# Patient Record
Sex: Male | Born: 2000 | Race: White | Hispanic: No | Marital: Single | Smoking: Never smoker
Health system: Southern US, Community
[De-identification: ages and names within clinical notes are randomized; demographics above are authoritative.]

## PROBLEM LIST (undated history)

## (undated) DIAGNOSIS — Z8489 Family history of other specified conditions: Secondary | ICD-10-CM

---
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
## 2021-05-21 DIAGNOSIS — J189 Pneumonia, unspecified organism: Secondary | ICD-10-CM

## 2021-05-21 HISTORY — DX: Pneumonia, unspecified organism: J18.9

## 2021-06-12 ENCOUNTER — Telehealth: Payer: Self-pay | Admitting: Pediatrics

## 2021-06-12 NOTE — Telephone Encounter (Signed)
Opened in error

## 2021-11-12 ENCOUNTER — Other Ambulatory Visit: Payer: Self-pay | Admitting: Orthopedic Surgery

## 2021-11-12 DIAGNOSIS — S72354D Nondisplaced comminuted fracture of shaft of right femur, subsequent encounter for closed fracture with routine healing: Secondary | ICD-10-CM

## 2021-11-16 ENCOUNTER — Ambulatory Visit
Admission: RE | Admit: 2021-11-16 | Discharge: 2021-11-16 | Disposition: A | Discharge status: Home / Self Care | Payer: BC Managed Care – PPO | Source: Ambulatory Visit | Attending: Orthopedic Surgery | Admitting: Orthopedic Surgery

## 2021-11-16 DIAGNOSIS — S72354D Nondisplaced comminuted fracture of shaft of right femur, subsequent encounter for closed fracture with routine healing: Secondary | ICD-10-CM

## 2021-12-02 ENCOUNTER — Other Ambulatory Visit: Payer: Self-pay

## 2021-12-14 ENCOUNTER — Encounter (HOSPITAL_COMMUNITY): Payer: Self-pay | Admitting: Orthopedic Surgery

## 2021-12-14 ENCOUNTER — Other Ambulatory Visit: Payer: Self-pay
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
## 2021-12-14 NOTE — Progress Notes (Signed)
I called the number listed for Steven Sherman, it is his mother's number, Art Buff,.  I explained to Ms MItchell that I will need Matthes's permission to speak with her, mother gave me patient's number.  I spoke to [facility] and he gave me permission to speak to his mother. ? ?Steven Sherman' s PCP is Dr. Vinnie Langton. Ms Clovis Riley stated that Binette has not had chest pain or shortness of breath. Ms Clovis Riley denies having any s/s of Covid in her household.  Patient denies any known exposure to Covid.  ? ?I instructed Ms Clovis Riley to have Nole to shower with antibacteria soap.  No nail polish, artificial or acrylic nails. Wear clean clothes, brush your teeth. ?Glasses, contact lens,dentures or partials may not be worn in the OR. If you need to wear them, please bring a case for glasses, do not wear contacts or bring a case, the hospital does not have contact cases, dentures or partials will have to be removed , make sure they are clean, we will provide a denture cup to put them in. You will need some one to drive you home and a responsible person over the age of 4 to stay with you for the first 24 hours after surgery.  ? ? ?

## 2021-12-15 ENCOUNTER — Ambulatory Visit (HOSPITAL_COMMUNITY)
Admission: RE | Admit: 2021-12-15 | Discharge: 2021-12-15 | Disposition: A | Discharge status: Home / Self Care | Payer: BC Managed Care – PPO | Attending: Orthopedic Surgery | Admitting: Orthopedic Surgery

## 2021-12-15 ENCOUNTER — Other Ambulatory Visit: Payer: Self-pay

## 2021-12-15 ENCOUNTER — Encounter (HOSPITAL_COMMUNITY)
Admission: RE | Disposition: A | Discharge status: Home / Self Care | Payer: Self-pay | Source: Home / Self Care | Attending: Orthopedic Surgery

## 2021-12-15 ENCOUNTER — Encounter (HOSPITAL_COMMUNITY): Payer: Self-pay | Admitting: Orthopedic Surgery

## 2021-12-15 ENCOUNTER — Ambulatory Visit (HOSPITAL_COMMUNITY): Payer: BC Managed Care – PPO

## 2021-12-15 ENCOUNTER — Ambulatory Visit (HOSPITAL_COMMUNITY): Payer: BC Managed Care – PPO | Admitting: Certified Registered"

## 2021-12-15 ENCOUNTER — Other Ambulatory Visit (HOSPITAL_COMMUNITY): Payer: Self-pay

## 2021-12-15 DIAGNOSIS — S72301A Unspecified fracture of shaft of right femur, initial encounter for closed fracture: Secondary | ICD-10-CM | POA: Insufficient documentation

## 2021-12-15 DIAGNOSIS — M79651 Pain in right thigh: Secondary | ICD-10-CM | POA: Diagnosis present

## 2021-12-15 DIAGNOSIS — S72351K Displaced comminuted fracture of shaft of right femur, subsequent encounter for closed fracture with nonunion: Secondary | ICD-10-CM

## 2021-12-15 HISTORY — PX: HARDWARE REMOVAL: SHX979

## 2021-12-15 HISTORY — DX: Family history of other specified conditions: Z84.89

## 2021-12-15 HISTORY — PX: FEMUR IM NAIL: SHX1597

## 2021-12-15 LAB — CBC WITH DIFFERENTIAL/PLATELET
Abs Immature Granulocytes: 0.01 10*3/uL (ref 0.00–0.07)
Basophils Absolute: 0 10*3/uL (ref 0.0–0.1)
Basophils Relative: 0 %
Eosinophils Absolute: 0.1 10*3/uL (ref 0.0–0.5)
Eosinophils Relative: 2 %
HCT: 42.5 % (ref 39.0–52.0)
Hemoglobin: 15.4 g/dL (ref 13.0–17.0)
Immature Granulocytes: 0 %
Lymphocytes Relative: 42 %
Lymphs Abs: 2.2 10*3/uL (ref 0.7–4.0)
MCH: 30.6 pg (ref 26.0–34.0)
MCHC: 36.2 g/dL — ABNORMAL HIGH (ref 30.0–36.0)
MCV: 84.5 fL (ref 80.0–100.0)
Monocytes Absolute: 0.6 10*3/uL (ref 0.1–1.0)
Monocytes Relative: 11 %
Neutro Abs: 2.3 10*3/uL (ref 1.7–7.7)
Neutrophils Relative %: 45 %
Platelets: 240 10*3/uL (ref 150–400)
RBC: 5.03 MIL/uL (ref 4.22–5.81)
RDW: 12.9 % (ref 11.5–15.5)
WBC: 5.2 10*3/uL (ref 4.0–10.5)
nRBC: 0 % (ref 0.0–0.2)

## 2021-12-15 LAB — COMPREHENSIVE METABOLIC PANEL
ALT: 18 U/L (ref 0–44)
AST: 17 U/L (ref 15–41)
Albumin: 4.3 g/dL (ref 3.5–5.0)
Alkaline Phosphatase: 92 U/L (ref 38–126)
Anion gap: 7 (ref 5–15)
BUN: 15 mg/dL (ref 6–20)
CO2: 24 mmol/L (ref 22–32)
Calcium: 9.3 mg/dL (ref 8.9–10.3)
Chloride: 107 mmol/L (ref 98–111)
Creatinine, Ser: 0.76 mg/dL (ref 0.61–1.24)
GFR, Estimated: >60 mL/min (ref >60)
Glucose, Bld: 90 mg/dL (ref 70–99)
Potassium: 3.7 mmol/L (ref 3.5–5.1)
Sodium: 138 mmol/L (ref 135–145)
Total Bilirubin: 0.7 mg/dL (ref 0.3–1.2)
Total Protein: 7.2 g/dL (ref 6.5–8.1)

## 2021-12-15 LAB — URINALYSIS, ROUTINE W REFLEX MICROSCOPIC
Bacteria, UA: NONE SEEN
Bilirubin Urine: NEGATIVE
Glucose, UA: NEGATIVE mg/dL
Ketones, ur: NEGATIVE mg/dL
Leukocytes,Ua: NEGATIVE
Nitrite: NEGATIVE
Protein, ur: NEGATIVE mg/dL
Specific Gravity, Urine: 1.02 (ref 1.005–1.030)
pH: 6 (ref 5.0–8.0)

## 2021-12-15 LAB — RAPID URINE DRUG SCREEN, HOSP PERFORMED
Amphetamines: NOT DETECTED
Barbiturates: NOT DETECTED
Benzodiazepines: NOT DETECTED
Cocaine: NOT DETECTED
Opiates: NOT DETECTED
Tetrahydrocannabinol: NOT DETECTED

## 2021-12-15 LAB — HEMOGLOBIN A1C
Hgb A1c MFr Bld: 4.5 % — ABNORMAL LOW (ref 4.8–5.6)
Mean Plasma Glucose: 82.45 mg/dL

## 2021-12-15 LAB — SEDIMENTATION RATE: Sed Rate: 2 mm/hr (ref 0–16)

## 2021-12-15 LAB — C-REACTIVE PROTEIN: CRP: 1 mg/dL — ABNORMAL HIGH (ref <1.0)

## 2021-12-15 SURGERY — INSERTION, INTRAMEDULLARY ROD, FEMUR
Anesthesia: General | Laterality: Right

## 2021-12-15 MED ORDER — CEFAZOLIN SODIUM-DEXTROSE 2-3 GM-%(50ML) IV SOLR
INTRAVENOUS | Status: DC | PRN
  Administered 2021-12-15: 2 g via INTRAVENOUS

## 2021-12-15 MED ORDER — LACTATED RINGERS IV SOLN: INTRAVENOUS | Status: DC

## 2021-12-15 MED ORDER — SUGAMMADEX SODIUM 200 MG/2ML IV SOLN
INTRAVENOUS | Status: DC | PRN
  Administered 2021-12-15: 200 mg via INTRAVENOUS

## 2021-12-15 MED ORDER — FENTANYL CITRATE (PF) 100 MCG/2ML IJ SOLN
INTRAMUSCULAR | Status: AC
  Filled 2021-12-15: qty 2

## 2021-12-15 MED ORDER — PROPOFOL 10 MG/ML IV BOLUS
INTRAVENOUS | Status: AC
  Filled 2021-12-15: qty 20

## 2021-12-15 MED ORDER — ROCURONIUM BROMIDE 10 MG/ML (PF) SYRINGE
PREFILLED_SYRINGE | INTRAVENOUS | Status: DC | PRN
  Administered 2021-12-15: 60 mg via INTRAVENOUS
  Administered 2021-12-15: 30 mg via INTRAVENOUS
  Administered 2021-12-15: 40 mg via INTRAVENOUS

## 2021-12-15 MED ORDER — ACETAMINOPHEN 10 MG/ML IV SOLN
INTRAVENOUS | Status: DC | PRN
  Administered 2021-12-15: 1000 mg via INTRAVENOUS

## 2021-12-15 MED ORDER — FENTANYL CITRATE (PF) 250 MCG/5ML IJ SOLN
INTRAMUSCULAR | Status: DC | PRN
  Administered 2021-12-15: 50 ug via INTRAVENOUS
  Administered 2021-12-15: 100 ug via INTRAVENOUS

## 2021-12-15 MED ORDER — MIDAZOLAM HCL 2 MG/2ML IJ SOLN
INTRAMUSCULAR | Status: DC | PRN
Start: 2021-12-15 — End: 2021-12-15
  Administered 2021-12-15: 2 mg via INTRAVENOUS

## 2021-12-15 MED ORDER — OXYCODONE-ACETAMINOPHEN 5-325 MG PO TABS
1.0000 | ORAL_TABLET | Freq: Three times a day (TID) | ORAL | 0 refills | Status: AC | PRN
Start: 2021-12-15 — End: 2022-12-15
  Filled 2021-12-15: qty 30, 3d supply, fill #0

## 2021-12-15 MED ORDER — CHLORHEXIDINE GLUCONATE 0.12 % MT SOLN
15.0000 mL | Freq: Once | OROMUCOSAL | Status: AC
  Administered 2021-12-15: 15 mL via OROMUCOSAL
  Filled 2021-12-15: qty 15

## 2021-12-15 MED ORDER — MIDAZOLAM HCL 2 MG/2ML IJ SOLN
INTRAMUSCULAR | Status: AC
  Filled 2021-12-15: qty 2

## 2021-12-15 MED ORDER — OXYCODONE HCL 5 MG PO TABS
ORAL_TABLET | ORAL | Status: AC
  Filled 2021-12-15: qty 1

## 2021-12-15 MED ORDER — FENTANYL CITRATE (PF) 250 MCG/5ML IJ SOLN
INTRAMUSCULAR | Status: AC
  Filled 2021-12-15: qty 5

## 2021-12-15 MED ORDER — DEXAMETHASONE SODIUM PHOSPHATE 10 MG/ML IJ SOLN
INTRAMUSCULAR | Status: DC | PRN
Start: 2021-12-15 — End: 2021-12-15
  Administered 2021-12-15: 5 mg via INTRAVENOUS

## 2021-12-15 MED ORDER — PROPOFOL 10 MG/ML IV BOLUS
INTRAVENOUS | Status: DC | PRN
  Administered 2021-12-15: 200 mg via INTRAVENOUS

## 2021-12-15 MED ORDER — OXYCODONE HCL 5 MG/5ML PO SOLN: 5.0000 mg | Freq: Once | ORAL | Status: AC | PRN

## 2021-12-15 MED ORDER — ONDANSETRON 4 MG PO TBDP
4.0000 mg | ORAL_TABLET | Freq: Three times a day (TID) | ORAL | 0 refills | Status: AC | PRN
  Filled 2021-12-15: qty 20, 7d supply, fill #0

## 2021-12-15 MED ORDER — ORAL CARE MOUTH RINSE: 15.0000 mL | Freq: Once | OROMUCOSAL | Status: AC

## 2021-12-15 MED ORDER — METHOCARBAMOL 500 MG PO TABS
500.0000 mg | ORAL_TABLET | Freq: Four times a day (QID) | ORAL | 0 refills | Status: AC | PRN
  Filled 2021-12-15: qty 60, 8d supply, fill #0

## 2021-12-15 MED ORDER — 0.9 % SODIUM CHLORIDE (POUR BTL) OPTIME
TOPICAL | Status: DC | PRN
  Administered 2021-12-15: 1000 mL

## 2021-12-15 MED ORDER — KETOROLAC TROMETHAMINE 10 MG PO TABS
10.0000 mg | ORAL_TABLET | Freq: Four times a day (QID) | ORAL | 0 refills | Status: AC | PRN
  Filled 2021-12-15: qty 20, 5d supply, fill #0

## 2021-12-15 MED ORDER — OXYCODONE HCL 5 MG PO TABS
5.0000 mg | ORAL_TABLET | Freq: Once | ORAL | Status: AC | PRN
  Administered 2021-12-15: 5 mg via ORAL

## 2021-12-15 MED ORDER — FENTANYL CITRATE (PF) 100 MCG/2ML IJ SOLN
25.0000 ug | INTRAMUSCULAR | Status: DC | PRN
  Administered 2021-12-15: 50 ug via INTRAVENOUS
  Administered 2021-12-15 (×2): 25 ug via INTRAVENOUS
  Administered 2021-12-15: 50 ug via INTRAVENOUS

## 2021-12-15 MED ORDER — LIDOCAINE 2% (20 MG/ML) 5 ML SYRINGE
INTRAMUSCULAR | Status: DC | PRN
  Administered 2021-12-15: 50 mg via INTRAVENOUS

## 2021-12-15 MED ORDER — ACETAMINOPHEN 10 MG/ML IV SOLN
INTRAVENOUS | Status: AC
  Filled 2021-12-15: qty 100

## 2021-12-15 MED ORDER — ONDANSETRON HCL 4 MG/2ML IJ SOLN: 4.0000 mg | Freq: Four times a day (QID) | INTRAMUSCULAR | Status: DC | PRN

## 2021-12-15 MED ORDER — ONDANSETRON HCL 4 MG/2ML IJ SOLN
INTRAMUSCULAR | Status: DC | PRN
Start: 2021-12-15 — End: 2021-12-15
  Administered 2021-12-15: 4 mg via INTRAVENOUS

## 2021-12-15 SURGICAL SUPPLY — 59 items
BAG COUNTER SPONGE SURGICOUNT (BAG) ×3 IMPLANT
BAG SPNG CNTER NS LX DISP (BAG) ×1
BANDAGE ESMARK 6X9 LF (GAUZE/BANDAGES/DRESSINGS) ×2 IMPLANT
BIT DRILL CALIBRATED 4.2 (BIT) IMPLANT
BIT DRILL SHORT 4.2 (BIT) IMPLANT
BNDG CMPR 9X6 STRL LF SNTH (GAUZE/BANDAGES/DRESSINGS) ×1
BNDG COHESIVE 6X5 TAN STRL LF (GAUZE/BANDAGES/DRESSINGS) ×3 IMPLANT
BNDG ESMARK 6X9 LF (GAUZE/BANDAGES/DRESSINGS) ×2
BNDG GAUZE ELAST 4 BULKY (GAUZE/BANDAGES/DRESSINGS) ×4 IMPLANT
BRUSH SCRUB EZ PLAIN DRY (MISCELLANEOUS) ×5 IMPLANT
COVER SURGICAL LIGHT HANDLE (MISCELLANEOUS) ×6 IMPLANT
DRAPE C-ARM 42X72 X-RAY (DRAPES) ×1 IMPLANT
DRAPE C-ARMOR (DRAPES) ×3 IMPLANT
DRAPE HALF SHEET 40X57 (DRAPES) ×1 IMPLANT
DRAPE INCISE IOBAN 66X45 STRL (DRAPES) ×1 IMPLANT
DRAPE ORTHO SPLIT 77X108 STRL (DRAPES) ×4
DRAPE SURG ORHT 6 SPLT 77X108 (DRAPES) ×4 IMPLANT
DRAPE U-SHAPE 47X51 STRL (DRAPES) ×3 IMPLANT
DRESSING MEPILEX FLEX 4X4 (GAUZE/BANDAGES/DRESSINGS) IMPLANT
DRILL BIT CALIBRATED 4.2 (BIT) ×2
DRILL BIT SHORT 4.2 (BIT) ×2
DRSG MEPILEX FLEX 4X4 (GAUZE/BANDAGES/DRESSINGS) ×8
ELECT REM PT RETURN 9FT ADLT (ELECTROSURGICAL) ×2
ELECTRODE REM PT RTRN 9FT ADLT (ELECTROSURGICAL) ×2 IMPLANT
GLOVE SRG 8 PF TXTR STRL LF DI (GLOVE) ×2 IMPLANT
GLOVE SURG ENC MOIS LTX SZ8 (GLOVE) ×3 IMPLANT
GLOVE SURG ORTHO LTX SZ7.5 (GLOVE) ×6 IMPLANT
GLOVE SURG UNDER POLY LF SZ7.5 (GLOVE) ×3 IMPLANT
GLOVE SURG UNDER POLY LF SZ8 (GLOVE) ×2
GOWN STRL REUS W/ TWL LRG LVL3 (GOWN DISPOSABLE) ×4 IMPLANT
GOWN STRL REUS W/ TWL XL LVL3 (GOWN DISPOSABLE) ×2 IMPLANT
GOWN STRL REUS W/TWL LRG LVL3 (GOWN DISPOSABLE) ×4
GOWN STRL REUS W/TWL XL LVL3 (GOWN DISPOSABLE) ×2
GUIDEWIRE 3.2X400 (WIRE) ×1 IMPLANT
KIT BASIN OR (CUSTOM PROCEDURE TRAY) ×3 IMPLANT
KIT TURNOVER KIT B (KITS) ×3 IMPLANT
MANIFOLD NEPTUNE II (INSTRUMENTS) ×3 IMPLANT
NAIL TI CANN TFNA 11X400MM RT (Nail) ×1 IMPLANT
NS IRRIG 1000ML POUR BTL (IV SOLUTION) ×3 IMPLANT
PACK ORTHO EXTREMITY (CUSTOM PROCEDURE TRAY) ×3 IMPLANT
PAD ARMBOARD 7.5X6 YLW CONV (MISCELLANEOUS) ×6 IMPLANT
REAMER ROD 3.8 BALL TIP 3X950 (ORTHOPEDIC DISPOSABLE SUPPLIES) ×1 IMPLANT
SCREW LOCK IM NL 5X64 STL (Screw) ×1 IMPLANT
SCREW LOCK IM TI 5X42 STRL (Screw) ×1 IMPLANT
SPONGE T-LAP 18X18 ~~LOC~~+RFID (SPONGE) ×3 IMPLANT
STAPLER VISISTAT 35W (STAPLE) ×3 IMPLANT
STOCKINETTE IMPERVIOUS LG (DRAPES) ×3 IMPLANT
STRIP CLOSURE SKIN 1/2X4 (GAUZE/BANDAGES/DRESSINGS) IMPLANT
SUT ETHILON 2 0 FS 18 (SUTURE) ×4 IMPLANT
SUT VIC AB 0 CT1 27 (SUTURE) ×4
SUT VIC AB 0 CT1 27XBRD ANBCTR (SUTURE) ×2 IMPLANT
SUT VIC AB 1 CT1 27 (SUTURE) ×2
SUT VIC AB 1 CT1 27XBRD ANBCTR (SUTURE) ×2 IMPLANT
SUT VIC AB 2-0 CT1 27 (SUTURE) ×2
SUT VIC AB 2-0 CT1 TAPERPNT 27 (SUTURE) ×2 IMPLANT
TOWEL GREEN STERILE FF (TOWEL DISPOSABLE) ×6 IMPLANT
TUBE CONNECTING 12X1/4 (SUCTIONS) ×3 IMPLANT
UNDERPAD 30X36 HEAVY ABSORB (UNDERPADS AND DIAPERS) ×3 IMPLANT
YANKAUER SUCT BULB TIP NO VENT (SUCTIONS) ×3 IMPLANT

## 2021-12-15 NOTE — Anesthesia Preprocedure Evaluation (Addendum)
Anesthesia Evaluation  ?Patient identified by MRN, date of birth, ID band ?Patient awake ? ? ? ?Reviewed: ?Allergy & Precautions, H&P , NPO status , Patient's Chart, lab work & pertinent test results ? ?Airway ?Mallampati: II ? ? ?Neck ROM: full ? ? ? Dental ?  ?Pulmonary ?neg pulmonary ROS,  ?  ?breath sounds clear to auscultation ? ? ? ? ? ? Cardiovascular ?negative cardio ROS ? ? ?Rhythm:regular Rate:Normal ? ? ?  ?Neuro/Psych ?  ? GI/Hepatic ?  ?Endo/Other  ? ? Renal/GU ?  ? ?  ?Musculoskeletal ?Right femur fx (05/2021) with IM nailing.  Now having right leg pain and non-union of femur injury.  ? Abdominal ?  ?Peds ? Hematology ?  ?Anesthesia Other Findings ? ? Reproductive/Obstetrics ? ?  ? ? ? ? ? ? ? ? ? ? ? ? ? ?  ?  ? ? ? ? ? ? ? ?Anesthesia Physical ?Anesthesia Plan ? ?ASA: 1 ? ?Anesthesia Plan: General  ? ?Post-op Pain Management:   ? ?Induction: Intravenous ? ?PONV Risk Score and Plan: 2 and Ondansetron, Dexamethasone, Midazolam and Treatment may vary due to age or medical condition ? ?Airway Management Planned: Oral ETT ? ?Additional Equipment:  ? ?Intra-op Plan:  ? ?Post-operative Plan: Extubation in OR ? ?Informed Consent: I have reviewed the patients History and Physical, chart, labs and discussed the procedure including the risks, benefits and alternatives for the proposed anesthesia with the patient or authorized representative who has indicated his/her understanding and acceptance.  ? ? ? ?Dental advisory given ? ?Plan Discussed with: CRNA, Anesthesiologist and Surgeon ? ?Anesthesia Plan Comments:   ? ? ? ? ? ? ?Anesthesia Quick Evaluation ? ?

## 2021-12-15 NOTE — OR Nursing (Signed)
1 rod, 2 screws explanted from patient's right femur and sent with patient per his request. Implants were soaked in hydrogen peroxide and wiped with a sani cloth.  ?

## 2021-12-15 NOTE — Discharge Instructions (Addendum)
? ?Orthopaedic Trauma Service Discharge Instructions ? ? ?General Discharge Instructions ? ?Orthopaedic Injuries: ? Right femur nonunion treated with exchanged nailing  ? ?WEIGHT BEARING STATUS: Weightbearing as tolerated Right leg, use crutches for balance  ? ?RANGE OF MOTION/ACTIVITY: unrestricted motion of Right hip and knee. Slowly increase activity level  ? ? ?Wound Care: daily wound care starting on 12/17/2021.  See below ? ?Discharge Wound Care Instructions ? ?Do NOT apply any ointments, solutions or lotions to pin sites or surgical wounds.  These prevent needed drainage and even though solutions like hydrogen peroxide kill bacteria, they also damage cells lining the pin sites that help fight infection.  Applying lotions or ointments can keep the wounds moist and can cause them to breakdown and open up as well. This can increase the risk for infection. When in doubt call the office. ? ?Surgical incisions should be dressed daily. ? ?If any drainage is noted, use one layer of adaptic or Mepitel, then gauze and tape.  Alternatively you can use mepilex dressings which are the beige silicone foam dressing you have on currently  ? ?[email or web address] ?https://dennis-soto.com/?pd_rd_i=[email or web address]&th=1 ? ?http://rojas.com/ ? ?These dressing supplies should be available at local medical supply stores ([employer], [employer], etc). They are not usually carried at places like [facility], [facility], [facility], etc ? ?Once the incision is completely dry and without drainage, it may be left open to air out.  Showering may begin 36-48 hours later.  Cleaning gently with soap and water. ? ?DVT/PE prophylaxis: move around, do not be sedentary  ? ?Diet: as you were eating previously.  Can use over the counter stool softeners and bowel  preparations, such as Miralax, to help with bowel movements.  Narcotics can be constipating.  Be sure to drink plenty of fluids ? ?PAIN MEDICATION USE AND EXPECTATIONS ? You have likely been given narcotic medications to help control your pain.  After a traumatic event that results in an fracture (broken bone) with or without surgery, it is ok to use narcotic pain medications to help control one's pain.  We understand that everyone responds to pain differently and each individual patient will be evaluated on a regular basis for the continued need for narcotic medications. Ideally, narcotic medication use should last no more than 6-8 weeks (coinciding with fracture healing).  ? As a patient it is your responsibility as well to monitor narcotic medication use and report the amount and frequency you use these medications when you come to your office visit.  ? We would also advise that if you are using narcotic medications, you should take a dose prior to therapy to maximize you participation. ? ?IF YOU ARE ON NARCOTIC MEDICATIONS IT IS NOT PERMISSIBLE TO OPERATE A MOTOR VEHICLE (MOTORCYCLE/CAR/TRUCK/MOPED) OR HEAVY MACHINERY ?DO NOT MIX NARCOTICS WITH OTHER CNS (CENTRAL NERVOUS SYSTEM) DEPRESSANTS SUCH AS ALCOHOL ? ? ?POST-OPERATIVE OPIOID TAPER INSTRUCTIONS: ?It is important to wean off of your opioid medication as soon as possible. If you do not need pain medication after your surgery it is ok to stop day one. ?Opioids include: ?Codeine, Hydrocodone(Norco, Vicodin), Oxycodone(Percocet, oxycontin) and hydromorphone amongst others.  ?Long term and even short term use of opiods can cause: ?Increased pain response ?Dependence ?Constipation ?Depression ?Respiratory depression ?And more.  ?Withdrawal symptoms can include ?Flu like symptoms ?Nausea, vomiting ?And more ?Techniques to manage these symptoms ?Hydrate well ?Eat regular healthy meals ?Stay active ?Use relaxation techniques(deep breathing, meditating, yoga) ?Do  Not substitute Alcohol to help with tapering ?If you have  been on opioids for less than two weeks and do not have pain than it is ok to stop all together.  ?Plan to wean off of opioids ?This plan should start within one week post op of your fracture surgery  ?Maintain the same interval or time between taking each dose and first decrease the dose.  ?Cut the total daily intake of opioids by one tablet each day ?Next start to increase the time between doses. ?The last dose that should be eliminated is the evening dose.  ? ? ?STOP SMOKING OR USING NICOTINE PRODUCTS!!!! ? As discussed nicotine severely impairs your body's ability to heal surgical and traumatic wounds but also impairs bone healing.  Wounds and bone heal by forming microscopic blood vessels (angiogenesis) and nicotine is a vasoconstrictor (essentially, shrinks blood vessels).  Therefore, if vasoconstriction occurs to these microscopic blood vessels they essentially disappear and are unable to deliver necessary nutrients to the healing tissue.  This is one modifiable factor that you can do to dramatically increase your chances of healing your injury.   ? (This means no smoking, no nicotine gum, patches, etc) ? ?DO NOT USE NONSTEROIDAL ANTI-INFLAMMATORY DRUGS (NSAID'S) ? Using products such as Advil (ibuprofen), Aleve (naproxen), Motrin (ibuprofen) for additional pain control during fracture healing can delay and/or prevent the healing response.  If you would like to take over the counter (OTC) medication, Tylenol (acetaminophen) is ok.  However, some narcotic medications that are given for pain control contain acetaminophen as well. Therefore, you should not exceed more than 4000 mg of tylenol in a day if you do not have liver disease.  Also note that there are may OTC medicines, such as cold medicines and allergy medicines that my contain tylenol as well.  If you have any questions about medications and/or interactions please ask your doctor/PA or your  pharmacist.  ?   ? ?ICE AND ELEVATE INJURED/OPERATIVE EXTREMITY ? Using ice and elevating the injured extremity above your heart can help with swelling and pain control.  Icing in a pulsatile fashion, such as 20 minutes on and 20 minutes off, can be followed.   ? Do not place ice directly on skin. Make sure there is a barrier between to skin and the ice pack.   ? Using frozen items such as frozen peas works well as the conform nicely to the are that needs to be iced. ? ?USE AN ACE WRAP OR TED HOSE FOR SWELLING CONTROL ? In addition to icing and elevation, Ace wraps or TED hose are used to help limit and resolve swelling.  It is recommended to use Ace wraps or TED hose until you are informed to stop.   ? When using Ace Wraps start the wrapping distally (farthest away from the body) and wrap proximally (closer to the body) ?  Example: If you had surgery on your leg or thing and you do not have a splint on, start the ace wrap at the toes and work your way up to the thigh ?       If you had surgery on your upper extremity and do not have a splint on, start the ace wrap at your fingers and work your way up to the upper arm ? ?IF YOU ARE IN A SPLINT OR CAST DO NOT REMOVE IT FOR ANY REASON  ? If your splint gets wet for any reason please contact the office immediately. You may shower in your splint or cast as long as you keep it  dry.  This can be done by wrapping in a cast cover or garbage back (or similar) ? Do Not stick any thing down your splint or cast such as pencils, money, or hangers to try and scratch yourself with.  If you feel itchy take benadryl as prescribed on the bottle for itching ? ?IF YOU ARE IN A CAM BOOT (BLACK BOOT) ? You may remove boot periodically. Perform daily dressing changes as noted below.  Wash the liner of the boot regularly and wear a sock when wearing the boot. It is recommended that you sleep in the boot until told otherwise ? ? ? ?Call office for the following: ?Temperature greater than  101F ?Persistent nausea and vomiting ?Severe uncontrolled pain ?Redness, tenderness, or signs of infection (pain, swelling, redness, odor or green/yellow discharge around the site) ?Difficulty breathing, headache or

## 2021-12-15 NOTE — Transfer of Care (Signed)
Immediate Anesthesia Transfer of Care Note ? ?Patient: Steven Sherman ? ?Procedure(s) Performed: INTRAMEDULLARY (IM) NAIL FEMORAL (Right) ?HARDWARE REMOVAL (Right) ? ?Patient Location: PACU ? ?Anesthesia Type:General ? ?Level of Consciousness: drowsy ? ?Airway & Oxygen Therapy: Patient Spontanous Breathing ? ?Post-op Assessment: Report given to RN and Post -op Vital signs reviewed and stable ? ?Post vital signs: Reviewed and stable ? ?Last Vitals:  ?Vitals Value Taken Time  ?BP 116/71 12/15/21 1030  ?Temp 37.3 ?C 12/15/21 1030  ?Pulse 100 12/15/21 1031  ?Resp 20 12/15/21 1031  ?SpO2 99 % 12/15/21 1031  ?Vitals shown include unvalidated device data. ? ?Last Pain:  ?Vitals:  ? 12/15/21 0706  ?TempSrc:   ?PainSc: 0-No pain  ?   ? ?  ? ?Complications: No notable events documented. ?

## 2021-12-15 NOTE — Anesthesia Procedure Notes (Signed)
Procedure Name: Intubation ?Date/Time: 12/15/2021 8:28 AM ?Performed by: Elliot Dally, CRNA ?Pre-anesthesia Checklist: Patient identified, Emergency Drugs available, Suction available and Patient being monitored ?Patient Re-evaluated:Patient Re-evaluated prior to induction ?Oxygen Delivery Method: Circle System Utilized ?Preoxygenation: Pre-oxygenation with 100% oxygen ?Induction Type: IV induction ?Ventilation: Mask ventilation without difficulty ?Laryngoscope Size: Hyacinth Meeker and 3 ?Grade View: Grade I ?Tube type: Oral ?Tube size: 7.0 mm ?Number of attempts: 1 ?Airway Equipment and Method: Stylet and Oral airway ?Placement Confirmation: ETT inserted through vocal cords under direct vision, positive ETCO2 and breath sounds checked- equal and bilateral ?Secured at: 22 cm ?Tube secured with: Tape ?Dental Injury: Teeth and Oropharynx as per pre-operative assessment  ? ? ? ? ?
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
## 2021-12-15 NOTE — Brief Op Note (Signed)
12/15/2021 ? ?3:48 PM ? ?PATIENT:  Steven Sherman  21 y.o. male ? ?PRE-OPERATIVE DIAGNOSIS:  NONUNION RIGHT PROXIMAL FEMUR ? ?POST-OPERATIVE DIAGNOSIS:  NONUNION RIGHT PROXIMAL FEMUR ? ?PROCEDURE:  Procedure(s): ?INTRAMEDULLARY (IM) NAIL FEMORAL (Right) ?HARDWARE REMOVAL (Right) ? ?SURGEON:  Surgeon(s) and Role: ?   Altamese Koppel, MD - Primary ? ?[id number] ?

## 2021-12-16 NOTE — Anesthesia Postprocedure Evaluation (Signed)
Anesthesia Post Note ? ?Patient: Steven Sherman ? ?Procedure(s) Performed: INTRAMEDULLARY (IM) NAIL FEMORAL (Right) ?HARDWARE REMOVAL (Right) ? ?  ? ?Patient location during evaluation: PACU ?Anesthesia Type: General ?Level of consciousness: awake and alert ?Pain management: pain level controlled ?Vital Signs Assessment: post-procedure vital signs reviewed and stable ?Respiratory status: spontaneous breathing, nonlabored ventilation, respiratory function stable and patient connected to nasal cannula oxygen ?Cardiovascular status: blood pressure returned to baseline and stable ?Postop Assessment: no apparent nausea or vomiting ?Anesthetic complications: no ? ? ?No notable events documented. ? ?Last Vitals:  ?Vitals:  ? 12/15/21 1115 12/15/21 1130  ?BP: 109/74 (!) 107/51  ?Pulse: 78 76  ?Resp: 11 20  ?Temp:  37.4 ?C  ?SpO2: 95% 96%  ?  ?Last Pain:  ?Vitals:  ? 12/15/21 1130  ?TempSrc:   ?PainSc: Asleep  ? ? ?  ?  ?  ?  ?  ?  ? ?Shiara Mcgough S ? ? ? ? ?

## 2021-12-17 ENCOUNTER — Encounter (HOSPITAL_COMMUNITY): Payer: Self-pay | Admitting: Orthopedic Surgery

## 2021-12-19 LAB — NICOTINE/COTININE METABOLITES
Cotinine: <1 ng/mL
Nicotine: <1 ng/mL

## 2021-12-20 LAB — AEROBIC/ANAEROBIC CULTURE W GRAM STAIN (SURGICAL/DEEP WOUND): Culture: NO GROWTH

## 2022-01-04 NOTE — Progress Notes (Signed)
Coding Query reponse ? ?Known causes of fracture nonunion include but are not limited to infection, nicotine and drug use, diabetes.  Standard pre-operative labs that we obtain prior to nonunion repair may include ESR, CRP, urine drug scree, nicotine levels and hgb a1c. These are obtained to evaluate for any additional correctable conditions  ? ?Jari Pigg, PA-C ?[phone] (C) ?01/04/2022, 11:57 AM ? ?[facility] ?[place] [address] ?[phone] Jenetta Downer) ?[phone] (F) ? ?  ?

## 2022-02-09 ENCOUNTER — Other Ambulatory Visit (HOSPITAL_COMMUNITY): Payer: Self-pay

## 2022-05-03 ENCOUNTER — Encounter: Payer: Self-pay | Admitting: Pediatrics
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
## 2023-02-08 IMAGING — DX DG FEMUR 2+V PORT*R*
1 series · 4 of 4 positions shown · non-contrast
Comparison: 12/15/2021 radiography.  CT 11/16/2021.

CLINICAL DATA: Intramedullary nail right femur.

EXAM:
RIGHT FEMUR PORTABLE 2 VIEW

[Series 1: femur · 0.14mm/px · 4 of 4 slices shown]
[im 1/4]
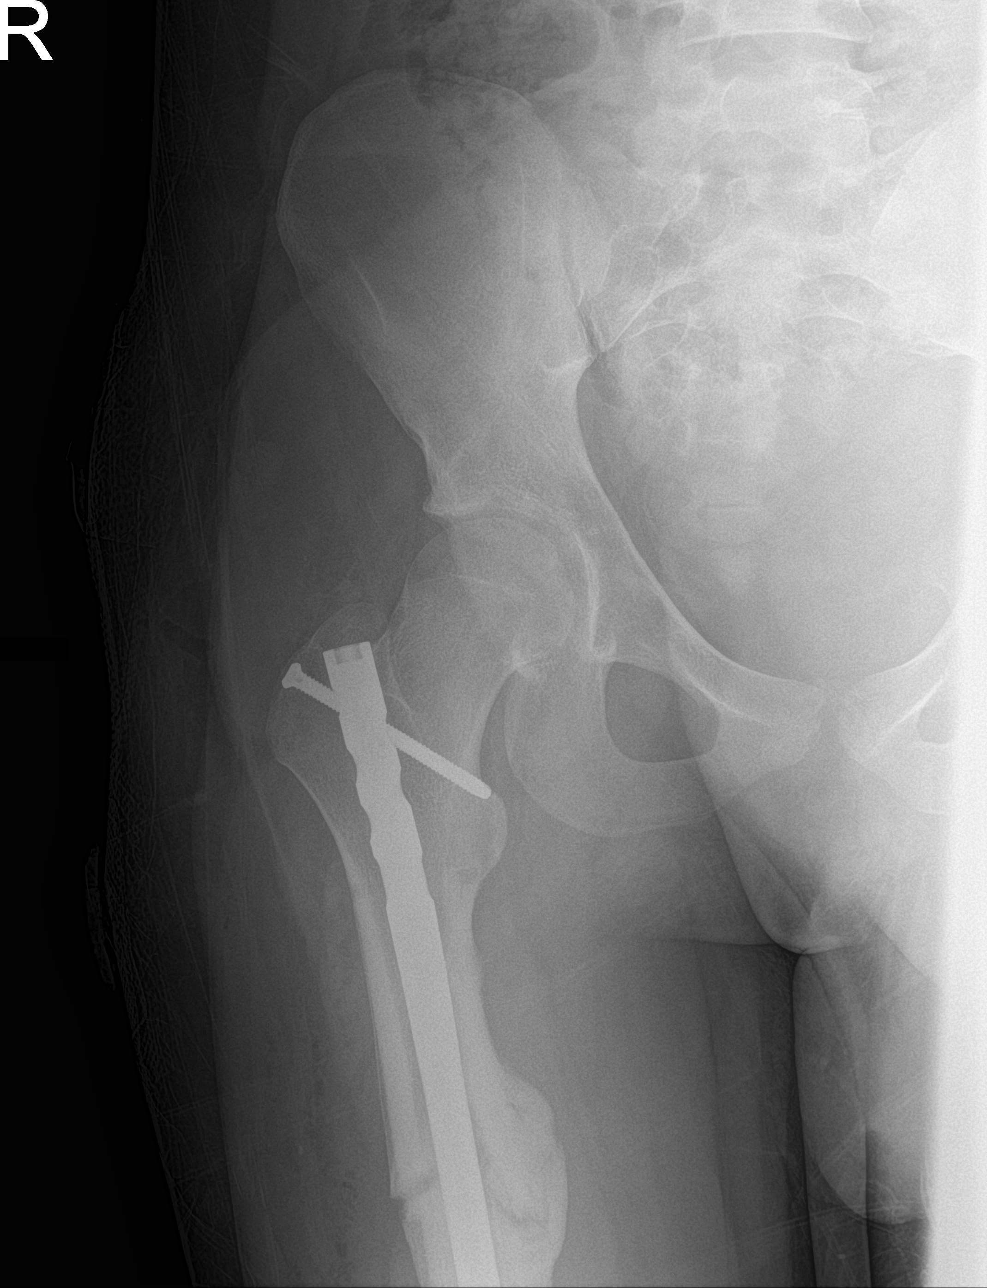
[im 2/4]
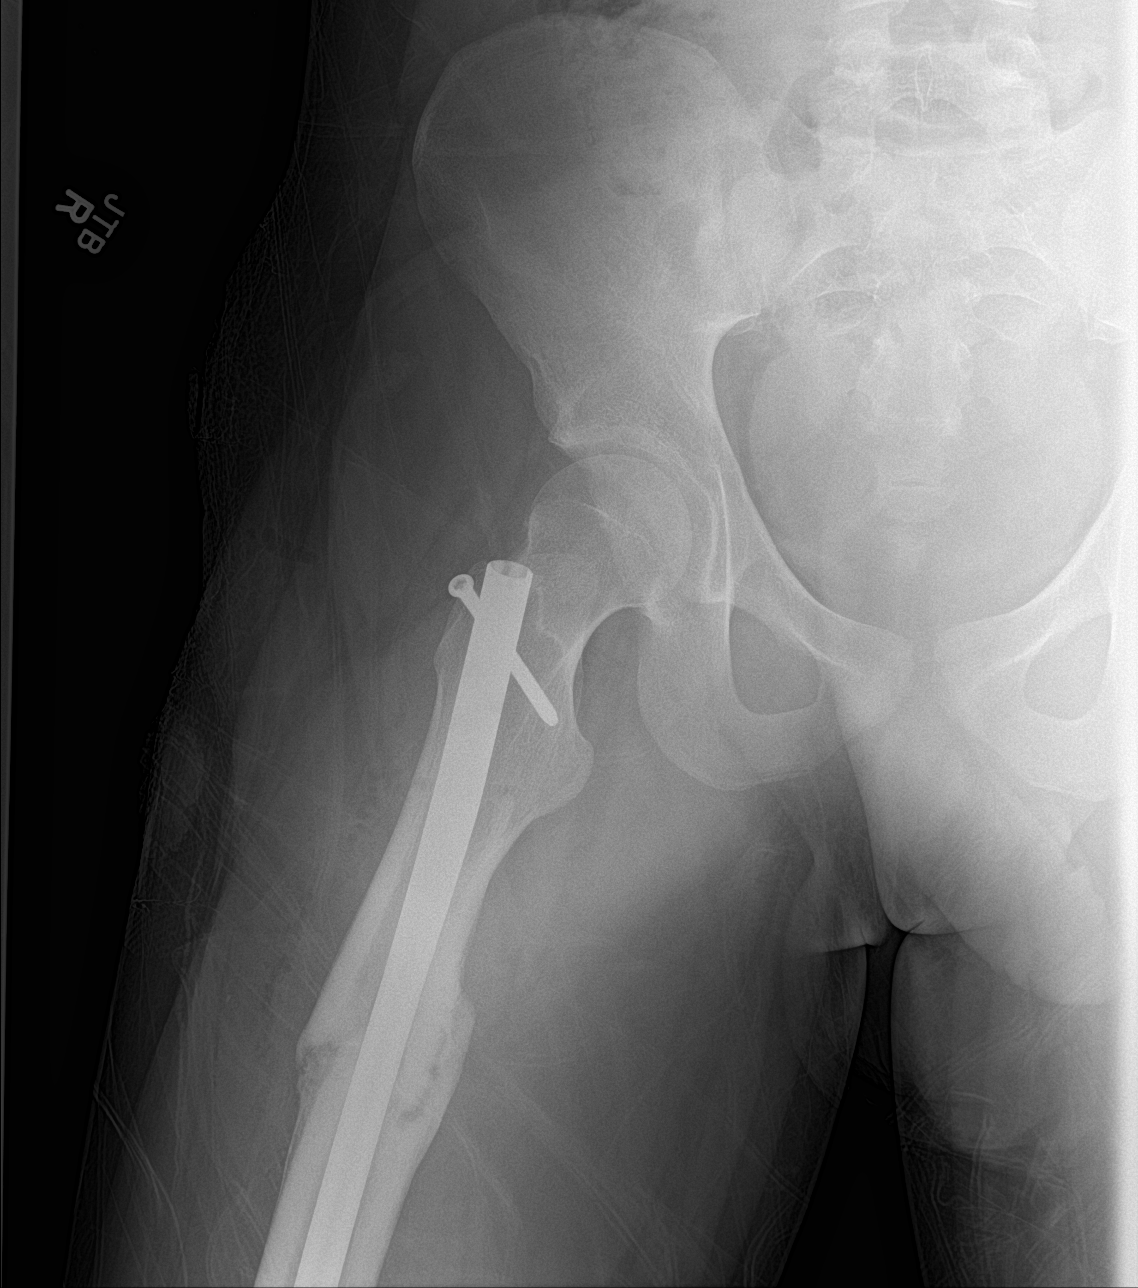
[im 3/4]
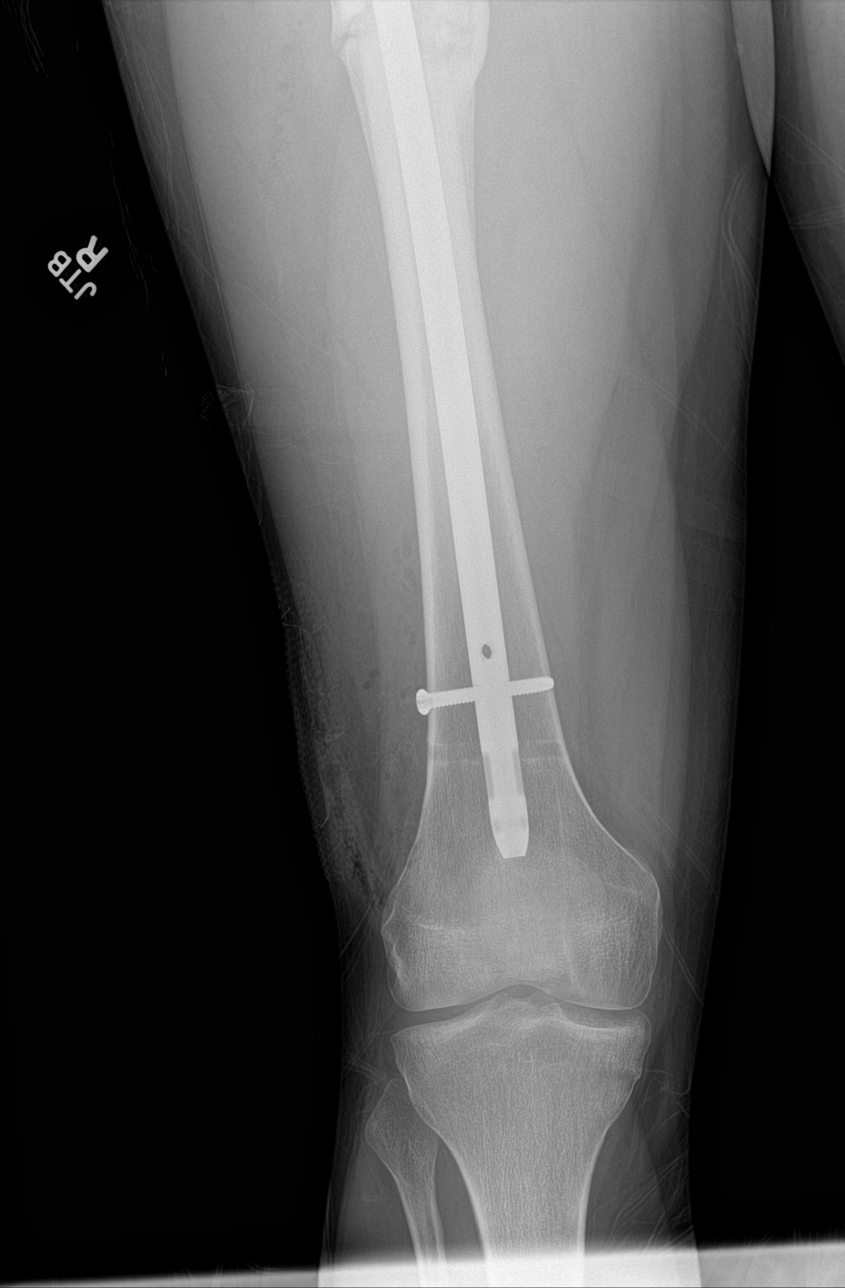
[im 4/4]
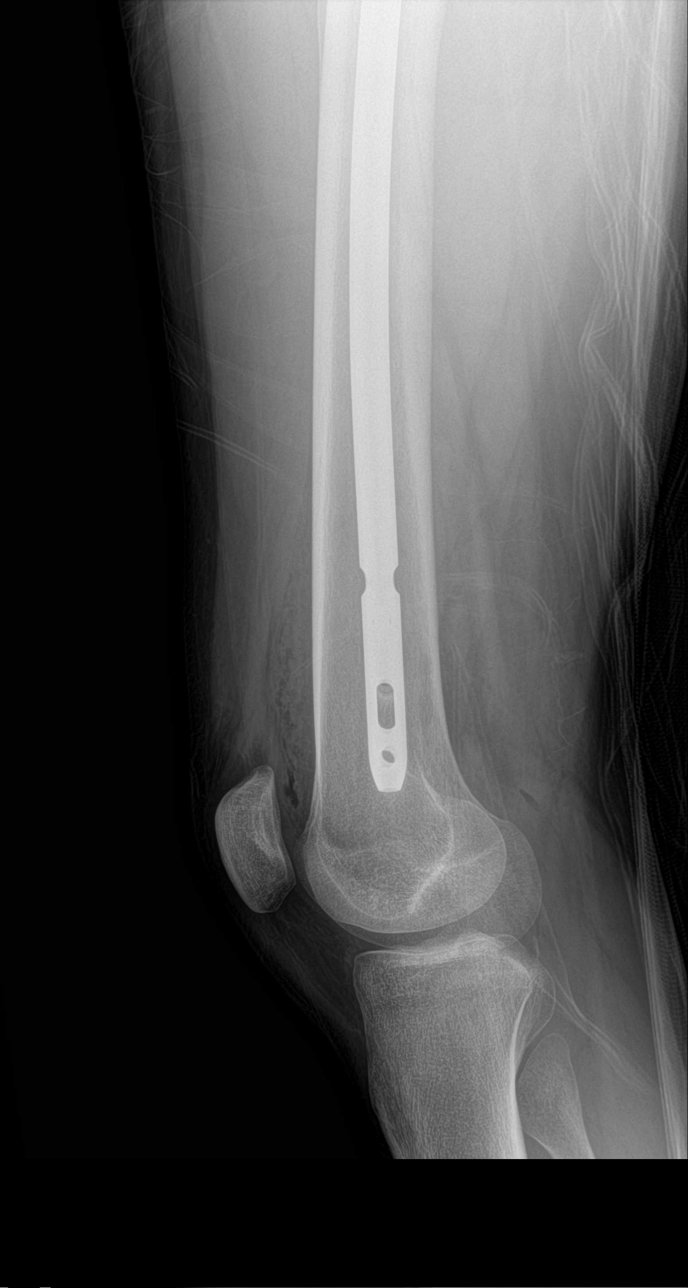

[4 of 4 positions shown; findings below may reference images not displayed]

FINDINGS: Revision of intramedullary nail of the right femur. Components
appear well positioned. No radiographically detectable complication.
Proximal and distal locking screw shadows from previous removal.
Continued normal alignment. Hypertrophic bony changes in the region
of the previous fracture as seen previously.
IMPRESSION: Good appearance following revision of intramedullary nail.

## 2023-05-31 ENCOUNTER — Encounter: Payer: Self-pay | Admitting: Pediatrics
